# Patient Record
Sex: Male | Born: 1965 | Race: Black or African American | Hispanic: No | Marital: Married | Smoking: Never smoker
Health system: Southern US, Community
[De-identification: ages and names within clinical notes are randomized; demographics above are authoritative.]

## PROBLEM LIST (undated history)

## (undated) DIAGNOSIS — K219 Gastro-esophageal reflux disease without esophagitis: Secondary | ICD-10-CM

## (undated) DIAGNOSIS — I10 Essential (primary) hypertension: Secondary | ICD-10-CM

---

## 2007-11-13 ENCOUNTER — Emergency Department (HOSPITAL_BASED_OUTPATIENT_CLINIC_OR_DEPARTMENT_OTHER): Admission: EM | Admit: 2007-11-13 | Discharge: 2007-11-13 | Payer: Self-pay | Admitting: Emergency Medicine

## 2017-03-16 ENCOUNTER — Other Ambulatory Visit: Payer: Self-pay

## 2017-03-16 ENCOUNTER — Emergency Department (HOSPITAL_BASED_OUTPATIENT_CLINIC_OR_DEPARTMENT_OTHER): Payer: No Typology Code available for payment source

## 2017-03-16 ENCOUNTER — Encounter (HOSPITAL_BASED_OUTPATIENT_CLINIC_OR_DEPARTMENT_OTHER): Payer: Self-pay | Admitting: *Deleted

## 2017-03-16 ENCOUNTER — Observation Stay (HOSPITAL_BASED_OUTPATIENT_CLINIC_OR_DEPARTMENT_OTHER)
Admission: EM | Admit: 2017-03-16 | Discharge: 2017-03-17 | Disposition: A | Discharge status: Home / Self Care | Payer: No Typology Code available for payment source | Attending: Cardiology | Admitting: Cardiology

## 2017-03-16 DIAGNOSIS — K219 Gastro-esophageal reflux disease without esophagitis: Secondary | ICD-10-CM

## 2017-03-16 DIAGNOSIS — N189 Chronic kidney disease, unspecified: Secondary | ICD-10-CM | POA: Diagnosis not present

## 2017-03-16 DIAGNOSIS — R079 Chest pain, unspecified: Secondary | ICD-10-CM | POA: Diagnosis present

## 2017-03-16 DIAGNOSIS — R9431 Abnormal electrocardiogram [ECG] [EKG]: Secondary | ICD-10-CM

## 2017-03-16 DIAGNOSIS — Z9189 Other specified personal risk factors, not elsewhere classified: Secondary | ICD-10-CM | POA: Insufficient documentation

## 2017-03-16 DIAGNOSIS — R0602 Shortness of breath: Principal | ICD-10-CM

## 2017-03-16 DIAGNOSIS — E785 Hyperlipidemia, unspecified: Secondary | ICD-10-CM | POA: Insufficient documentation

## 2017-03-16 DIAGNOSIS — I10 Essential (primary) hypertension: Secondary | ICD-10-CM | POA: Insufficient documentation

## 2017-03-16 HISTORY — DX: Essential (primary) hypertension: I10

## 2017-03-16 HISTORY — DX: Gastro-esophageal reflux disease without esophagitis: K21.9

## 2017-03-16 LAB — CBC
HEMATOCRIT: 39.9 % (ref 39.0–52.0)
HEMATOCRIT: 41.1 % (ref 39.0–52.0)
HEMOGLOBIN: 13.6 g/dL (ref 13.0–17.0)
HEMOGLOBIN: 14.2 g/dL (ref 13.0–17.0)
MCH: 28.5 pg (ref 26.0–34.0)
MCH: 29 pg (ref 26.0–34.0)
MCHC: 34.1 g/dL (ref 30.0–36.0)
MCHC: 34.5 g/dL (ref 30.0–36.0)
MCV: 83.5 fL (ref 78.0–100.0)
MCV: 83.9 fL (ref 78.0–100.0)
Platelets: 188 10*3/uL (ref 150–400)
Platelets: 193 10*3/uL (ref 150–400)
RBC: 4.78 MIL/uL (ref 4.22–5.81)
RBC: 4.9 MIL/uL (ref 4.22–5.81)
RDW: 13.3 % (ref 11.5–15.5)
RDW: 13.3 % (ref 11.5–15.5)
WBC: 4.5 10*3/uL (ref 4.0–10.5)
WBC: 5.2 10*3/uL (ref 4.0–10.5)

## 2017-03-16 LAB — CREATININE, SERUM
Creatinine, Ser: 1.52 mg/dL — ABNORMAL HIGH (ref 0.61–1.24)
GFR calc Af Amer: 59 mL/min — ABNORMAL LOW (ref >60)
GFR calc non Af Amer: 51 mL/min — ABNORMAL LOW (ref >60)

## 2017-03-16 LAB — HEMOGLOBIN A1C
HEMOGLOBIN A1C: 6.2 % — AB (ref 4.8–5.6)
MEAN PLASMA GLUCOSE: 131.24 mg/dL

## 2017-03-16 LAB — TROPONIN I
Troponin I: <0.03 ng/mL (ref <0.03)
Troponin I: <0.03 ng/mL (ref <0.03)

## 2017-03-16 LAB — BRAIN NATRIURETIC PEPTIDE
B NATRIURETIC PEPTIDE 5: 12 pg/mL (ref 0.0–100.0)
B Natriuretic Peptide: 9.6 pg/mL (ref 0.0–100.0)

## 2017-03-16 LAB — BASIC METABOLIC PANEL
ANION GAP: 4 — AB (ref 5–15)
BUN: 13 mg/dL (ref 6–20)
CALCIUM: 9.2 mg/dL (ref 8.9–10.3)
CO2: 28 mmol/L (ref 22–32)
Chloride: 105 mmol/L (ref 101–111)
Creatinine, Ser: 1.49 mg/dL — ABNORMAL HIGH (ref 0.61–1.24)
GFR calc Af Amer: >60 mL/min (ref >60)
GFR calc non Af Amer: 52 mL/min — ABNORMAL LOW (ref >60)
GLUCOSE: 100 mg/dL — AB (ref 65–99)
Potassium: 3.8 mmol/L (ref 3.5–5.1)
Sodium: 137 mmol/L (ref 135–145)

## 2017-03-16 MED ORDER — ASPIRIN 81 MG PO CHEW
324.0000 mg | CHEWABLE_TABLET | Freq: Once | ORAL | Status: AC
  Administered 2017-03-16: 324 mg via ORAL
  Filled 2017-03-16: qty 4

## 2017-03-16 MED ORDER — NITROGLYCERIN 0.4 MG SL SUBL: 0.4000 mg | SUBLINGUAL_TABLET | SUBLINGUAL | Status: DC | PRN

## 2017-03-16 MED ORDER — SODIUM CHLORIDE 0.9 % IV SOLN
Freq: Once | INTRAVENOUS | Status: AC
  Administered 2017-03-16: 17:00:00 via INTRAVENOUS

## 2017-03-16 MED ORDER — HYDRALAZINE HCL 20 MG/ML IJ SOLN
10.0000 mg | Freq: Once | INTRAMUSCULAR | Status: AC
  Administered 2017-03-16: 10 mg via INTRAVENOUS
  Filled 2017-03-16: qty 1

## 2017-03-16 MED ORDER — HEPARIN SODIUM (PORCINE) 5000 UNIT/ML IJ SOLN
5000.0000 [IU] | Freq: Three times a day (TID) | INTRAMUSCULAR | Status: DC
  Administered 2017-03-17: 5000 [IU] via SUBCUTANEOUS
  Filled 2017-03-16: qty 1

## 2017-03-16 MED ORDER — ASPIRIN EC 81 MG PO TBEC
81.0000 mg | DELAYED_RELEASE_TABLET | Freq: Every day | ORAL | Status: DC
  Administered 2017-03-17: 81 mg via ORAL
  Filled 2017-03-16: qty 1

## 2017-03-16 MED ORDER — IOPAMIDOL (ISOVUE-370) INJECTION 76%
100.0000 mL | Freq: Once | INTRAVENOUS | Status: AC | PRN
  Administered 2017-03-16: 88 mL via INTRAVENOUS

## 2017-03-16 MED ORDER — ACETAMINOPHEN 325 MG PO TABS
650.0000 mg | ORAL_TABLET | ORAL | Status: DC | PRN
Start: 2017-03-16 — End: 2017-03-17
  Administered 2017-03-16 – 2017-03-17 (×2): 650 mg via ORAL
  Filled 2017-03-16 (×2): qty 2

## 2017-03-16 NOTE — ED Notes (Signed)
Pt ambulated 2 laps around the unit while on SpO2 monitoring. Pt had no episodes of dyspnea. HR elevated to 95 at the highest and the lowest SpO2 was 94%. HR immediately returned to 75 once pt returned to bed.

## 2017-03-16 NOTE — ED Triage Notes (Signed)
SOB for a month. He was seen by his MD today with changes in his EKG.

## 2017-03-16 NOTE — Progress Notes (Signed)
Patient discussed with Dr Donnald GarrePfeiffer. 51 yo male presents with primarily symptoms of SOB. CAD risk factors include HTN, HL, male over 7645. EKG with inferior and alteral T-wave inversions, no prior to compare. Troponins negative x 2 however only 2 hrs apart. Based on his symptoms, risk factors, and abnormal EKG I have recommended overnight obs with cycling of enzymes, EKGs. Echo in AM, possible stress test tomorrow AM. He is to be transferred to tele bed to cardiology service.   Dominga FerryJ Siomara Burkel MD

## 2017-03-16 NOTE — ED Notes (Signed)
Pt denies any hx of CP, nausea, or diaphoresis. Pt denies ShOB at this time, states it is intermittent with periods during rest, and with activity.

## 2017-03-16 NOTE — ED Notes (Signed)
[facility] arrived to transport pt 

## 2017-03-16 NOTE — ED Provider Notes (Signed)
[facility] EMERGENCY DEPARTMENT Provider Note   CSN: [id number] Arrival date & time: 03/16/17  1431     History   Chief Complaint Chief Complaint  Patient presents with  . Shortness of Breath    HPI Travis Harrell is a 51 y.o. male.  HPI Has had shortness of breath for couple weeks.  He reports that it comes and goes and sometimes is pretty bad with exertion, climbing the stairs in his home or doing cleaning at home can make him short of breath.  He reports if he rests it will resolve and he can resume activities.  He denies he is gotten chest pain with this.  No fever no cough.  No lower extremity swelling or pain.  Patient does for living drive cars.  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  No history of PE or DVT.  And finally went to see his doctor today due to the symptoms persisting and increasing.  EKG abnormality was identified and patient was referred to the emergency department. Past Medical History:  Diagnosis Date  . GERD (gastroesophageal reflux disease)   . Hypertension     Patient Active Problem List   Diagnosis Date Noted  . Chest pain 03/16/2017    History reviewed. No pertinent surgical history.     Home Medications    Prior to Admission medications   Medication Sig Start Date End Date Taking? Authorizing Provider  ATORVASTATIN CALCIUM PO Take by mouth.   Yes [provider]  Esomeprazole Magnesium (NEXIUM PO) Take by mouth.   Yes [provider]  LISINOPRIL PO Take by mouth.   Yes [provider]    Family History No family history on file.  Social History Social History   Tobacco Use  . Smoking status: Never Smoker  . Smokeless tobacco: Never Used  Substance Use Topics  . Alcohol use: No    Frequency: Never  . Drug use: No     Allergies   Patient has no known allergies.   Review of Systems Review of Systems 10  Systems reviewed and are negative for acute change except as noted in the HPI.  Physical Exam Updated Vital Signs BP (!) 139/93   Pulse (!) 53   Temp 98.2 F (36.8 C) (Oral)   Resp 15   Ht 6' (1.829 m)   Wt 96.6 kg (213 lb)   SpO2 100%   BMI 28.89 kg/m   Physical Exam  Constitutional: He is oriented to person, place, and time. He appears well-developed and well-nourished.  HENT:  Head: Normocephalic and atraumatic.  Nose: Nose normal.  Mouth/Throat: Oropharynx is clear and moist.  Eyes: Conjunctivae and EOM are normal.  Neck: Neck supple.  Cardiovascular: Normal rate, regular rhythm, normal heart sounds and intact distal pulses.  No murmur heard. Pulmonary/Chest: Effort normal and breath sounds normal. No respiratory distress.  Abdominal: Soft. He exhibits no distension. There is no tenderness. There is no guarding.  Musculoskeletal: Normal range of motion. He exhibits no edema or tenderness.  Neurological: He is alert and oriented to person, place, and time. No cranial nerve deficit. He exhibits normal muscle tone. Coordination normal.  Skin: Skin is warm and dry.  Psychiatric: He has a normal mood and affect.  Nursing note and vitals reviewed.    ED Treatments / Results  Labs (all labs ordered are listed, but only abnormal results are displayed) Labs  Reviewed  BASIC METABOLIC PANEL - Abnormal; Notable for the following components:      Result Value   Glucose, Bld 100 (*)    Creatinine, Ser 1.49 (*)    GFR calc non Af Amer 52 (*)    Anion gap 4 (*)    All other components within normal limits  CBC  TROPONIN I  BRAIN NATRIURETIC PEPTIDE  TROPONIN I    EKG  EKG Interpretation  Date/Time:  Tuesday March 16 2017 14:58:04 EST Ventricular Rate:  63 PR Interval:    QRS Duration: 95 QT Interval:  412 QTC Calculation: 422 R Axis:   40 Text Interpretation:  Sinus rhythm Nonspecific T abnormalities, diffuse leads agree. no old comparison Confirmed by Arby Barrette [phone]) on 03/16/2017 4:24:16 PM       Radiology Dg Chest 2 View  Result Date: 03/16/2017 CLINICAL DATA:  Shortness of breath. EXAM: CHEST  2 VIEW COMPARISON:  No prior . FINDINGS: Mediastinum hilar structures normal. Heart size normal. Lungs are clear. Mild elevation left hemidiaphragm. No pleural effusion or pneumothorax. No acute bony abnormality . IMPRESSION: 1. No acute cardiopulmonary disease. 2. Mild elevation left hemidiaphragm. Electronically Signed   By: Maisie Fus  Register   On: 03/16/2017 15:40   Ct Angio Chest Pe W/cm &/or Wo Cm  Result Date: 03/16/2017 CLINICAL DATA:  Shortness of breath for the past month. EXAM: CT ANGIOGRAPHY CHEST WITH CONTRAST TECHNIQUE: Multidetector CT imaging of the chest was performed using the standard protocol during bolus administration of intravenous contrast. Multiplanar CT image reconstructions and MIPs were obtained to evaluate the vascular anatomy. CONTRAST:  88mL ISOVUE-370 IOPAMIDOL (ISOVUE-370) INJECTION 76% COMPARISON:  Chest x-ray from same day. FINDINGS: Cardiovascular: Satisfactory opacification of the pulmonary arteries to the segmental level. No evidence of pulmonary embolism. Normal heart size. No pericardial effusion. Normal caliber thoracic aorta. Mediastinum/Nodes: No enlarged mediastinal, hilar, or axillary lymph nodes. Thyroid gland, trachea, and esophagus demonstrate no significant findings. Lungs/Pleura: Minimal bibasilar atelectasis. The lungs are otherwise clear. No focal consolidation, pleural effusion, or pneumothorax. No suspicious pulmonary nodule. Upper Abdomen: No acute abnormality. Musculoskeletal: Bilateral gynecomastia. No acute or significant osseous findings. Review of the MIP images confirms the above findings. IMPRESSION: 1. No evidence of pulmonary embolism. No acute intrathoracic process. Electronically Signed   By: Obie Dredge M.D.   On: 03/16/2017 17:00    Procedures Procedures (including critical care  time)  Medications Ordered in ED Medications  aspirin chewable tablet 324 mg (324 mg Oral Given 03/16/17 1654)  0.9 %  sodium chloride infusion ( Intravenous New Bag/Given 03/16/17 1658)  iopamidol (ISOVUE-370) 76 % injection 100 mL (88 mLs Intravenous Contrast Given 03/16/17 1644)     Initial Impression / Assessment and Plan / ED Course  I have reviewed the triage vital signs and the nursing notes.  Pertinent labs & imaging results that were available during my care of the patient were reviewed by me and considered in my medical decision making (see chart for details).    Consult: Reviewed with Dr. Wyline Mood of cardiology.  Based on EKG review, he advises for observation in the hospital and admission to his service.  Final Clinical Impressions(s) / ED Diagnoses   Final diagnoses:  EKG abnormality  Shortness of breath  Multiple risk factors for coronary artery disease   Patient is alert and appropriate.  No respiratory distress at rest.  Vital signs stable.  Aspirin provided.  Diagnostic workup has ruled out for PE.  With significant cardiac  risk factors and EKG anomaly, plan will be for admission to cardiology service. ED Discharge Orders    None       Arby BarrettePfeiffer, Gracin Mcpartland, MD 03/16/17 2009

## 2017-03-16 NOTE — H&P (Signed)
Cardiology Admission History and Physical:   Patient ID: CAYNE YOM; MRN: [id number]; DOB: 06/05/1965   Admission date: 03/16/2017  Primary Care Provider: Wilburn Mylar, MD Primary Cardiologist: None Primary Electrophysiologist:  None  Chief Complaint: DOE  Patient Profile:   Travis Harrell is a 51 y.o. male with a history of HTN and HLD, and CKD who is admitted for DOE.  History of Present Illness:   Travis Harrell is a 51 y.o. male with a history of HTN and HLD, and CKD who is admitted for DOE.  The patient reports that over the past several weeks he has had progressive DOE. He his now dyspneic with moderate exertion such as walking up stairs or carrying heavy objects. He has had some headaches but denies chest pain, palpitations, orthopnea, LE edema or other complaints.   He presented to [facility] ED with his symptoms. ECG showed TWI in inferior and lateral leads. Troponin was negative x2. Labs were otherwise notable for Cr 1.49 (baseline 1.6). He underwent CTA that showed no PE. His case was discussed with [facility] cardiology, and he was transferred for further management.  On arrival to [facility], BP 143/103 and HR 72. ECG showed similar ST changes. He reported some headache but denied chest pain, dyspnea or other symptoms.    Past Medical History:  Diagnosis Date  . GERD (gastroesophageal reflux disease)   . Hypertension     History reviewed. No pertinent surgical history.   Medications Prior to Admission: Prior to Admission medications   Medication Sig Start Date End Date Taking? Authorizing Provider  ATORVASTATIN CALCIUM PO Take by mouth.   Yes [provider]  Esomeprazole Magnesium (NEXIUM PO) Take by mouth.   Yes [provider]  LISINOPRIL PO Take by mouth.   Yes [provider]     Allergies:   No Known Allergies  Social History:   Social History   Socioeconomic History  . Marital status: Married    Spouse name: Not on file    . Number of children: Not on file  . Years of education: Not on file  . Highest education level: Not on file  Social Needs  . Financial resource strain: Not on file  . Food insecurity - worry: Not on file  . Food insecurity - inability: Not on file  . Transportation needs - medical: Not on file  . Transportation needs - non-medical: Not on file  Occupational History  . Not on file  Tobacco Use  . Smoking status: Never Smoker  . Smokeless tobacco: Never Used  Substance and Sexual Activity  . Alcohol use: No    Frequency: Never  . Drug use: No  . Sexual activity: Not on file  Other Topics Concern  . Not on file  Social History Narrative  . Not on file   Works as Education officer, environmental  Family History:  Reports "heart problems" in father, although is unable to specify  ROS:  Please see the history of present illness.  All other ROS reviewed and negative.     Physical Exam/Data:   Vitals:   03/16/17 2020 03/16/17 2030 03/16/17 2101 03/16/17 2148  BP: (!) 150/114 (!) 157/108 (!) 150/97 (!) 143/103  Pulse: (!) 58 84 67 72  Resp: 16 18 16 18   Temp:    98.3 F (36.8 C)  TempSrc:    Oral  SpO2: 100% 100% 100% 100%  Weight:    92.8 kg (204 lb 8 oz)  Height:  6' (1.829 m)   No intake or output data in the 24 hours ending 03/16/17 2233 Filed Weights   03/16/17 1440 03/16/17 2148  Weight: 96.6 kg (213 lb) 92.8 kg (204 lb 8 oz)   Body mass index is 27.74 kg/m.  General:  Well nourished, well developed, in no acute distress HEENT: normal Lymph: no adenopathy Neck: no elevation of JVD Cardiac:  normal S1, S2; RRR; no murmur  Lungs:  clear to auscultation bilaterally, no wheezing, rhonchi or rales  Abd: soft, nontender, no hepatomegaly  Ext: no LE edema Musculoskeletal:  No deformities, BUE and BLE strength normal and equal Skin: warm and dry  Neuro:  No focal abnormalities noted Psych:  Normal affect    EKG:  The ECG that was done and was personally reviewed and demonstrates TWI  as described above  Relevant CV Studies: No prior   Laboratory Data:  Chemistry Recent Labs  Lab 03/16/17 1523  NA 137  K 3.8  CL 105  CO2 28  GLUCOSE 100*  BUN 13  CREATININE 1.49*  CALCIUM 9.2  GFRNONAA 52*  GFRAA >60  ANIONGAP 4*    No results for input(s): PROT, ALBUMIN, AST, ALT, ALKPHOS, BILITOT in the last 168 hours. Hematology Recent Labs  Lab 03/16/17 1523  WBC 4.5  RBC 4.78  HGB 13.6  HCT 39.9  MCV 83.5  MCH 28.5  MCHC 34.1  RDW 13.3  PLT 188   Cardiac Enzymes Recent Labs  Lab 03/16/17 1523 03/16/17 1744  TROPONINI <0.03 <0.03   No results for input(s): TROPIPOC in the last 168 hours.  BNP Recent Labs  Lab 03/16/17 1523  BNP 9.6    DDimer No results for input(s): DDIMER in the last 168 hours.  Radiology/Studies:  Dg Chest 2 View  Result Date: 03/16/2017 CLINICAL DATA:  Shortness of breath. EXAM: CHEST  2 VIEW COMPARISON:  No prior . FINDINGS: Mediastinum hilar structures normal. Heart size normal. Lungs are clear. Mild elevation left hemidiaphragm. No pleural effusion or pneumothorax. No acute bony abnormality . IMPRESSION: 1. No acute cardiopulmonary disease. 2. Mild elevation left hemidiaphragm. Electronically Signed   By: Maisie Fushomas  Register   On: 03/16/2017 15:40   Ct Angio Chest Pe W/cm &/or Wo Cm  Result Date: 03/16/2017 CLINICAL DATA:  Shortness of breath for the past month. EXAM: CT ANGIOGRAPHY CHEST WITH CONTRAST TECHNIQUE: Multidetector CT imaging of the chest was performed using the standard protocol during bolus administration of intravenous contrast. Multiplanar CT image reconstructions and MIPs were obtained to evaluate the vascular anatomy. CONTRAST:  88mL ISOVUE-370 IOPAMIDOL (ISOVUE-370) INJECTION 76% COMPARISON:  Chest x-ray from same day. FINDINGS: Cardiovascular: Satisfactory opacification of the pulmonary arteries to the segmental level. No evidence of pulmonary embolism. Normal heart size. No pericardial effusion. Normal caliber  thoracic aorta. Mediastinum/Nodes: No enlarged mediastinal, hilar, or axillary lymph nodes. Thyroid gland, trachea, and esophagus demonstrate no significant findings. Lungs/Pleura: Minimal bibasilar atelectasis. The lungs are otherwise clear. No focal consolidation, pleural effusion, or pneumothorax. No suspicious pulmonary nodule. Upper Abdomen: No acute abnormality. Musculoskeletal: Bilateral gynecomastia. No acute or significant osseous findings. Review of the MIP images confirms the above findings. IMPRESSION: 1. No evidence of pulmonary embolism. No acute intrathoracic process. Electronically Signed   By: Obie DredgeWilliam T Derry M.D.   On: 03/16/2017 17:00    Assessment and Plan:   Travis Harrell is a 51 y.o. male with a history of HTN and HLD, and CKD who is admitted for DOE.  DOE The  patient presented with progressive DOE of unclear etiology. CTA shows no PE. ECG shows non-specific changes, and troponin is negative x2. His presentation is not consistent with acute MI, but his dyspnea may represent an angina equivalent. He has no known CAD but does have risk factors including HTN, HLD and male gender. Heart failure can also be considered as a cause of his symptoms, although he does not have other signs and symptoms of hypervolemia. At this time, will continue to trend troponin and monitor symptoms. Will plan for stress test and echocardiogram in AM unless there is a clinical change that would warrant direct angiography. -Continue to monitor on telemetry -Continue to monitor symptoms and trend troponin -BNP ordered  -Will plan for echocardiogram and nuclear stress test in AM (pharmacologic nuclear given progressive exercise intolerance) -Lipid panel, HgA1c ordered  HTN BP elevated in the ED, likely in setting of discomfort.  -Continue lisinopril -Add additonal covearage as needed  HLD -Lipid panel ordered -Continue atorvastatin  CKD Cr 1.49 in ED, which appears to be about at baseline per  review of CareEverywhere. He follows with an outpatient nephrologist. -Continue to monitor -Will need to be cautious with contrast agents  GERD -Continue PPI   Severity of Illness: The appropriate patient status for this patient is OBSERVATION. Observation status is judged to be reasonable and necessary in order to provide the required intensity of service to ensure the patient's safety. The patient's presenting symptoms, physical exam findings, and initial radiographic and laboratory data in the context of their medical condition is felt to place them at decreased risk for further clinical deterioration. Furthermore, it is anticipated that the patient will be medically stable for discharge from the hospital within 2 midnights of admission. The following factors support the patient status of observation.   " The patient's presenting symptoms include DOE. " The physical exam findings include n/a. " The initial radiographic and laboratory data are not consistent with acute MI.     For questions or updates, please contact [facility] Please consult [email or web address] for contact info under Cardiology/STEMI.    Signed, Ernest Mallickaylor Emberli Ballester, MD  03/16/2017 10:33 PM

## 2017-03-16 NOTE — ED Notes (Signed)
EDP notified of pt's B/P. See new orders.  

## 2017-03-17 ENCOUNTER — Observation Stay (HOSPITAL_BASED_OUTPATIENT_CLINIC_OR_DEPARTMENT_OTHER): Payer: No Typology Code available for payment source

## 2017-03-17 DIAGNOSIS — R079 Chest pain, unspecified: Secondary | ICD-10-CM | POA: Diagnosis not present

## 2017-03-17 DIAGNOSIS — I110 Hypertensive heart disease with heart failure: Secondary | ICD-10-CM

## 2017-03-17 DIAGNOSIS — R0602 Shortness of breath: Secondary | ICD-10-CM

## 2017-03-17 DIAGNOSIS — I10 Essential (primary) hypertension: Secondary | ICD-10-CM

## 2017-03-17 DIAGNOSIS — E7800 Pure hypercholesterolemia, unspecified: Secondary | ICD-10-CM

## 2017-03-17 LAB — CBC
HCT: 40.7 % (ref 39.0–52.0)
Hemoglobin: 13.5 g/dL (ref 13.0–17.0)
MCH: 28.1 pg (ref 26.0–34.0)
MCHC: 33.2 g/dL (ref 30.0–36.0)
MCV: 84.6 fL (ref 78.0–100.0)
Platelets: 199 10*3/uL (ref 150–400)
RBC: 4.81 MIL/uL (ref 4.22–5.81)
RDW: 13.2 % (ref 11.5–15.5)
WBC: 5.2 10*3/uL (ref 4.0–10.5)

## 2017-03-17 LAB — ECHOCARDIOGRAM COMPLETE
HEIGHTINCHES: 72 in
Weight: 3272 oz

## 2017-03-17 LAB — BASIC METABOLIC PANEL
Anion gap: 10 (ref 5–15)
BUN: 9 mg/dL (ref 6–20)
CALCIUM: 9 mg/dL (ref 8.9–10.3)
CHLORIDE: 105 mmol/L (ref 101–111)
CO2: 24 mmol/L (ref 22–32)
CREATININE: 1.43 mg/dL — AB (ref 0.61–1.24)
GFR calc Af Amer: >60 mL/min (ref >60)
GFR calc non Af Amer: 55 mL/min — ABNORMAL LOW (ref >60)
GLUCOSE: 102 mg/dL — AB (ref 65–99)
POTASSIUM: 4 mmol/L (ref 3.5–5.1)
SODIUM: 139 mmol/L (ref 135–145)

## 2017-03-17 LAB — LIPID PANEL
CHOL/HDL RATIO: 3.8 ratio
CHOLESTEROL: 171 mg/dL (ref 0–200)
HDL: 45 mg/dL (ref >40)
LDL Cholesterol: 103 mg/dL — ABNORMAL HIGH (ref 0–99)
Triglycerides: 117 mg/dL (ref <150)
VLDL: 23 mg/dL (ref 0–40)

## 2017-03-17 LAB — NM MYOCAR MULTI W/SPECT W/WALL MOTION / EF
CHL CUP MPHR: 167 {beats}/min
CSEPHR: 62 %
Peak HR: 105 {beats}/min
Rest HR: 62 {beats}/min

## 2017-03-17 LAB — TROPONIN I: Troponin I: <0.03 ng/mL (ref <0.03)

## 2017-03-17 LAB — HIV ANTIBODY (ROUTINE TESTING W REFLEX): HIV Screen 4th Generation wRfx: NONREACTIVE

## 2017-03-17 MED ORDER — AMLODIPINE BESYLATE 5 MG PO TABS: 5.0000 mg | ORAL_TABLET | Freq: Every day | ORAL | 6 refills | Status: AC

## 2017-03-17 MED ORDER — REGADENOSON 0.4 MG/5ML IV SOLN
0.4000 mg | Freq: Once | INTRAVENOUS | Status: AC
  Administered 2017-03-17: 0.4 mg via INTRAVENOUS

## 2017-03-17 MED ORDER — AMLODIPINE BESYLATE 5 MG PO TABS: 5.0000 mg | ORAL_TABLET | Freq: Every day | ORAL | Status: DC

## 2017-03-17 MED ORDER — TECHNETIUM TC 99M TETROFOSMIN IV KIT
10.0000 | PACK | Freq: Once | INTRAVENOUS | Status: AC | PRN
  Administered 2017-03-17: 10 via INTRAVENOUS

## 2017-03-17 MED ORDER — ATORVASTATIN CALCIUM 40 MG PO TABS
40.0000 mg | ORAL_TABLET | Freq: Every day | ORAL | Status: DC
  Administered 2017-03-17: 40 mg via ORAL
  Filled 2017-03-17: qty 1

## 2017-03-17 MED ORDER — LISINOPRIL 10 MG PO TABS
10.0000 mg | ORAL_TABLET | Freq: Every day | ORAL | Status: DC
  Administered 2017-03-17: 10 mg via ORAL
  Filled 2017-03-17: qty 1

## 2017-03-17 MED ORDER — PANTOPRAZOLE SODIUM 40 MG PO TBEC
40.0000 mg | DELAYED_RELEASE_TABLET | Freq: Every day | ORAL | Status: DC
  Administered 2017-03-17: 40 mg via ORAL
  Filled 2017-03-17: qty 1

## 2017-03-17 MED ORDER — TECHNETIUM TC 99M TETROFOSMIN IV KIT
29.0000 | PACK | Freq: Once | INTRAVENOUS | Status: AC | PRN
  Administered 2017-03-17: 29 via INTRAVENOUS

## 2017-03-17 MED ORDER — REGADENOSON 0.4 MG/5ML IV SOLN
INTRAVENOUS | Status: AC
Start: 2017-03-17 — End: 2017-03-17
  Filled 2017-03-17: qty 5

## 2017-03-17 NOTE — Plan of Care (Signed)
  Pain Managment: General experience of comfort will improve 03/17/2017 0959 - Progressing by Cathi Roan, RN Note Pt has been pain free this shift, only complains of intermittent shortness of breath; stress test pending   Education: Knowledge of General Education information will improve 03/17/2017 0959 - Completed/Met by Cathi Roan, RN

## 2017-03-17 NOTE — Plan of Care (Signed)
  Progressing Education: Knowledge of General Education information will improve 03/17/2017 0401 - Progressing by Horris Latinouvall, Darlys Buis G, RN Note Pt informed of valuables policy and educated on his plan of care and unit routine.  Oriented to room and unit, and instructed on use of call light and phone.  Alonza BogusDuvall, Marquel Spoto Gray  03/17/2017 [id number] - Progressing by Horris Latinouvall, Miquel Lamson G, RN Pain Managment: General experience of comfort will improve 03/17/2017 0401 - Progressing by Horris Latinouvall, Stepan Verrette G, RN Note Denies c/o chest pain during this shift.  Alonza BogusDuvall, Shakoya Gilmore Gray  03/17/2017 [id number] - Progressing by Horris Latinouvall, Yanis Larin G, RN

## 2017-03-17 NOTE — Progress Notes (Signed)
  Echocardiogram 2D Echocardiogram has been performed.  Travis SkeenVijay  Brodin Harrell 03/17/2017, 11:19 AM

## 2017-03-17 NOTE — Discharge Instructions (Signed)

## 2017-03-17 NOTE — Discharge Summary (Signed)
Discharge Summary    Patient ID: Travis Harrell,  MRN: [id number], DOB/AGE: 10/31/1965 51 y.o.  Admit date: 03/16/2017 Discharge date: 03/17/2017   Primary Care Provider: Wilburn MylarKelly, Samuel S Primary Cardiologist: new - Dr. Anne FuSkains  Discharge Diagnoses    Principal Problem:   Shortness of breath Active Problems:   Chest pain   CKD (chronic kidney disease)   GERD (gastroesophageal reflux disease)   HLD (hyperlipidemia)   Multiple risk factors for coronary artery disease   Essential hypertension   Allergies No Known Allergies   History of Present Illness     Travis Harrell is a 51 y.o. male with a history of HTN and HLD, and CKD who is admitted for DOE.  The patient reports that over the past several weeks he has had progressive DOE. He his now dyspneic with moderate exertion such as walking up stairs or carrying heavy objects. He has had some headaches but denies chest pain, palpitations, orthopnea, LE edema or other complaints.   He presented to [facility] with his symptoms. ECG showed TWI in inferior and lateral leads. Troponin was negative x2. Labs were otherwise notable for Cr 1.49 (baseline 1.6). He underwent CTA that showed no PE. [facility], and he was transferred for further management.  [facility] BP 143/103 and HR 72. ECG showed similar ST changes. He reported some headache but denied chest pain, dyspnea or other symptoms.   Hospital Course     Consultants: none  He was admitted to cardiology and underwent echocardiogram which showed normal LV function, no WMA, and moderate LVH that is likely related to hypertensive heart disease. Pressures were largely uncontrolled this admission. Norvasc 5 mg was added to his home lisinopril. He was instructed to keep a BP log and bring this to his next doctor's appt.   He also underwent NM lexiscan myoview that showed no evidence of reversible ischemia. T wave inversions were noted,  but this is likely related to his hypertensive heart disease.   Patient seen and examined by Dr. Anne FuSkains today and was stable for discharge. All follow up has been arranged.  _____________  Discharge Vitals Blood pressure (!) 156/113, pulse 86, temperature 97.6 F (36.4 C), temperature source Oral, resp. rate 18, height 6' (1.829 m), weight 204 lb 8 oz (92.8 kg), SpO2 100 %.  Filed Weights   03/16/17 1440 03/16/17 2148  Weight: 213 lb (96.6 kg) 204 lb 8 oz (92.8 kg)    Labs & Radiologic Studies    CBC Recent Labs    03/16/17 2214 03/17/17 0352  WBC 5.2 5.2  HGB 14.2 13.5  HCT 41.1 40.7  MCV 83.9 84.6  PLT 193 199   Basic Metabolic Panel Recent Labs    40/98/1110/07/30 1523 03/16/17 2214 03/17/17 0352  NA 137  --  139  K 3.8  --  4.0  CL 105  --  105  CO2 28  --  24  GLUCOSE 100*  --  102*  BUN 13  --  9  CREATININE 1.49* 1.52* 1.43*  CALCIUM 9.2  --  9.0   Liver Function Tests No results for input(s): AST, ALT, ALKPHOS, BILITOT, PROT, ALBUMIN in the last 72 hours. No results for input(s): LIPASE, AMYLASE in the last 72 hours. Cardiac Enzymes Recent Labs    03/16/17 1744 03/16/17 2214 03/17/17 0352  TROPONINI <0.03 <0.03 <0.03   BNP Invalid input(s): POCBNP D-Dimer No results for  input(s): DDIMER in the last 72 hours. Hemoglobin A1C Recent Labs    03/16/17 2214  HGBA1C 6.2*   Fasting Lipid Panel Recent Labs    03/17/17 0352  CHOL 171  HDL 45  LDLCALC 103*  TRIG 117  CHOLHDL 3.8   Thyroid Function Tests No results for input(s): TSH, T4TOTAL, T3FREE, THYROIDAB in the last 72 hours.  Invalid input(s): FREET3 _____________  Dg Chest 2 View  Result Date: 03/16/2017 CLINICAL DATA:  Shortness of breath. EXAM: CHEST  2 VIEW COMPARISON:  No prior . FINDINGS: Mediastinum hilar structures normal. Heart size normal. Lungs are clear. Mild elevation left hemidiaphragm. No pleural effusion or pneumothorax. No acute bony abnormality . IMPRESSION: 1. No acute  cardiopulmonary disease. 2. Mild elevation left hemidiaphragm. Electronically Signed   By: Maisie Fushomas  Register   On: 03/16/2017 15:40   Ct Angio Chest Pe W/cm &/or Wo Cm  Result Date: 03/16/2017 CLINICAL DATA:  Shortness of breath for the past month. EXAM: CT ANGIOGRAPHY CHEST WITH CONTRAST TECHNIQUE: Multidetector CT imaging of the chest was performed using the standard protocol during bolus administration of intravenous contrast. Multiplanar CT image reconstructions and MIPs were obtained to evaluate the vascular anatomy. CONTRAST:  88mL ISOVUE-370 IOPAMIDOL (ISOVUE-370) INJECTION 76% COMPARISON:  Chest x-ray from same day. FINDINGS: Cardiovascular: Satisfactory opacification of the pulmonary arteries to the segmental level. No evidence of pulmonary embolism. Normal heart size. No pericardial effusion. Normal caliber thoracic aorta. Mediastinum/Nodes: No enlarged mediastinal, hilar, or axillary lymph nodes. Thyroid gland, trachea, and esophagus demonstrate no significant findings. Lungs/Pleura: Minimal bibasilar atelectasis. The lungs are otherwise clear. No focal consolidation, pleural effusion, or pneumothorax. No suspicious pulmonary nodule. Upper Abdomen: No acute abnormality. Musculoskeletal: Bilateral gynecomastia. No acute or significant osseous findings. Review of the MIP images confirms the above findings. IMPRESSION: 1. No evidence of pulmonary embolism. No acute intrathoracic process. Electronically Signed   By: Obie DredgeWilliam T Derry M.D.   On: 03/16/2017 17:00   Nm Myocar Multi W/spect W/wall Motion / Ef  Result Date: 03/17/2017 CLINICAL DATA:  Chest pain. History of hypertension, hyperlipidemia and chronic kidney disease. EXAM: MYOCARDIAL IMAGING WITH SPECT (REST AND PHARMACOLOGIC-STRESS) GATED LEFT VENTRICULAR WALL MOTION STUDY LEFT VENTRICULAR EJECTION FRACTION TECHNIQUE: Standard myocardial SPECT imaging was performed after resting intravenous injection of 10 mCi Tc-5611m tetrofosmin. Subsequently,  intravenous infusion of Lexiscan was performed under the supervision of the Cardiology staff. At peak effect of the drug, 30 mCi Tc-3211m tetrofosmin was injected intravenously and standard myocardial SPECT imaging was performed. Quantitative gated imaging was also performed to evaluate left ventricular wall motion, and estimate left ventricular ejection fraction. COMPARISON:  Chest radiograph- 03/16/2017; chest CT - 03/16/2017 FINDINGS: Raw images: No significant chest wall or diaphragmatic attenuation. No significant patient motion artifact. Focal area of contamination is seen overlying the peripheral aspect the left upper abdomen on the provided stress images. Perfusion: There is a grossly matched area of attenuation involving the apical aspect of the septum without associated regional wall motion abnormality. No scintigraphic evidence of prior infarction or pharmacologically induced ischemia. Wall Motion: Normal left ventricular wall motion. No left ventricular dilation. Left Ventricular Ejection Fraction: 52 % End diastolic volume 113 ml End systolic volume 54 ml IMPRESSION: 1. No scintigraphic evidence of prior infarction pharmacologically induced ischemia. 2. Normal left ventricular wall motion. 3. Left ventricular ejection fraction 52% 4. Non invasive risk stratification*: Low *2012 Appropriate Use Criteria for Coronary Revascularization Focused Update: J Am Coll Cardiol. 2012;59(9):857-881. http://content.dementiazones.comonlinejacc.org/article.aspx?articleid=1201161 Electronically Signed   By: Jonny RuizJohn  Watts M.D.   On: 03/17/2017 12:18     Diagnostic Studies/Procedures    Echo 03/17/17: Study Conclusions - Left ventricle: The cavity size was normal. Wall thickness was   increased in a pattern of moderate LVH. Systolic function was   normal. The estimated ejection fraction was in the range of 60%   to 65%. Wall motion was normal; there were no regional wall   motion abnormalities. Left ventricular diastolic function    parameters were normal.   Myoview 03/17/17: 1. No scintigraphic evidence of prior infarction pharmacologically induced ischemia. 2. Normal left ventricular wall motion. 3. Left ventricular ejection fraction 52% 4. Non invasive risk stratification*: Low  Disposition   Pt is being discharged home today in good condition.  Follow-up Plans & Appointments     Discharge Instructions    Diet - low sodium heart healthy   Complete by:  As directed    Increase activity slowly   Complete by:  As directed       Discharge Medications   Allergies as of 03/17/2017   No Known Allergies     Medication List    TAKE these medications   acetaminophen 325 MG tablet Commonly known as:  TYLENOL Take 650 mg by mouth every 6 (six) hours as needed for mild pain.   amLODipine 5 MG tablet Commonly known as:  NORVASC Take 1 tablet (5 mg total) by mouth daily.   ARTIFICIAL TEARS OP Place 1 tablet into both eyes daily as needed (dry eyes).   atorvastatin 40 MG tablet Commonly known as:  LIPITOR Take 40 mg by mouth daily.   cholecalciferol 1000 units tablet Commonly known as:  VITAMIN D Take 1,000 Units by mouth daily.   lisinopril 20 MG tablet Commonly known as:  PRINIVIL,ZESTRIL Take 20 mg by mouth daily.         Follow up with PCP or cardiology for HTN.  Duration of Discharge Encounter   Greater than 30 minutes including physician time.  Signed, Roe Rutherford Duke PA-C 03/17/2017, 2:32 PM   Personally seen and examined. Agree with above. 51 year old with intermittent shortness of breath.  T wave inversions noted on ECG.  Prompted further evaluation.  Negative for PE.  On lisinopril 10 mg for hypertension.  Blood pressure high.  Nuclear stress test was reassuring with no ischemia.  Echocardiogram showed normal ejection fraction with moderate LVH, suggestive of hypertensive heart disease.  On exam, regular rate and rhythm, lungs are clear, pleasant, moves all  extremities.  Intermittent dyspnea -May be related to hypertension, possible anxiety component as well.  Further testing was reassuring. -He may be discharged with increase in blood pressure medication.  I will add amlodipine 5 mg once a day.  He may need a diuretic as well such as chlorthalidone in the future.  Hypertensive heart disease -This is most likely why his T wave inversions are noted on ECG.  Hypertension.  LVH.  Continue to treat more aggressively.  Adding amlodipine.  Hyperlipidemia -Continue with atorvastatin.  Based upon his recent hospitalization, I am comfortable with him returning to work next Monday.  Continue to follow-up with primary physician.  Donato Schultz, MD

## 2017-03-17 NOTE — Progress Notes (Signed)
Progress Note  Patient Name: Travis Harrell Date of Encounter: 03/17/2017  Primary Cardiologist: new - Dr. Wyline MoodBranch / Dr. Anne FuSkains  Subjective   Pt was intermittently dyspneic overnight, no chest pain  Inpatient Medications    Scheduled Meds: . aspirin EC  81 mg Oral Daily  . atorvastatin  40 mg Oral q1800  . heparin  5,000 Units Subcutaneous Q8H  . lisinopril  10 mg Oral Daily  . pantoprazole  40 mg Oral Daily   Continuous Infusions:  PRN Meds: acetaminophen, nitroGLYCERIN   Vital Signs    Vitals:   03/16/17 2101 03/16/17 2148 03/17/17 0629 03/17/17 0851  BP: (!) 150/97 (!) 143/103 (!) 136/91 (!) 147/100  Pulse: 67 72 66 60  Resp: 16 18 18    Temp:  98.3 F (36.8 C) 98.1 F (36.7 C)   TempSrc:  Oral Oral   SpO2: 100% 100% 98%   Weight:  204 lb 8 oz (92.8 kg)    Height:  6' (1.829 m)      Intake/Output Summary (Last 24 hours) at 03/17/2017 0919 Last data filed at 03/16/2017 2359 Gross per 24 hour  Intake 1240 ml  Output -  Net 1240 ml   Filed Weights   03/16/17 1440 03/16/17 2148  Weight: 213 lb (96.6 kg) 204 lb 8 oz (92.8 kg)     Physical Exam   General: Well developed, well nourished, male appearing in no acute distress. Head: Normocephalic, atraumatic.  Neck: Supple without bruits, no JVD Lungs:  Resp regular and unlabored, CTA. Heart: RRR, S1, S2, no S3, S4, or murmur; no rub. Abdomen: Soft, non-tender, non-distended with normoactive bowel sounds. No hepatomegaly. No rebound/guarding. No obvious abdominal masses. Extremities: No clubbing, cyanosis, edema. Distal pedal pulses are 2+ bilaterally. Neuro: Alert and oriented X 3. Moves all extremities spontaneously. Psych: Normal affect.  Labs    Chemistry Recent Labs  Lab 03/16/17 1523 03/16/17 2214 03/17/17 0352  NA 137  --  139  K 3.8  --  4.0  CL 105  --  105  CO2 28  --  24  GLUCOSE 100*  --  102*  BUN 13  --  9  CREATININE 1.49* 1.52* 1.43*  CALCIUM 9.2  --  9.0  GFRNONAA 52* 51*  55*  GFRAA >60 59* >60  ANIONGAP 4*  --  10     Hematology Recent Labs  Lab 03/16/17 1523 03/16/17 2214 03/17/17 0352  WBC 4.5 5.2 5.2  RBC 4.78 4.90 4.81  HGB 13.6 14.2 13.5  HCT 39.9 41.1 40.7  MCV 83.5 83.9 84.6  MCH 28.5 29.0 28.1  MCHC 34.1 34.5 33.2  RDW 13.3 13.3 13.2  PLT 188 193 199    Cardiac Enzymes Recent Labs  Lab 03/16/17 1523 03/16/17 1744 03/16/17 2214 03/17/17 0352  TROPONINI <0.03 <0.03 <0.03 <0.03   No results for input(s): TROPIPOC in the last 168 hours.   BNP Recent Labs  Lab 03/16/17 1523 03/16/17 2214  BNP 9.6 12.0     DDimer No results for input(s): DDIMER in the last 168 hours.   Radiology    Dg Chest 2 View  Result Date: 03/16/2017 CLINICAL DATA:  Shortness of breath. EXAM: CHEST  2 VIEW COMPARISON:  No prior . FINDINGS: Mediastinum hilar structures normal. Heart size normal. Lungs are clear. Mild elevation left hemidiaphragm. No pleural effusion or pneumothorax. No acute bony abnormality . IMPRESSION: 1. No acute cardiopulmonary disease. 2. Mild elevation left hemidiaphragm. Electronically Signed   By:  Thomas  Register   On: 03/16/2017 15:40   Ct Angio Chest Pe W/cm &/or Wo Cm  Result Date: 03/16/2017 CLINICAL DATA:  Shortness of breath for the past month. EXAM: CT ANGIOGRAPHY CHEST WITH CONTRAST TECHNIQUE: Multidetector CT imaging of the chest was performed using the standard protocol during bolus administration of intravenous contrast. Multiplanar CT image reconstructions and MIPs were obtained to evaluate the vascular anatomy. CONTRAST:  88mL ISOVUE-370 IOPAMIDOL (ISOVUE-370) INJECTION 76% COMPARISON:  Chest x-ray from same day. FINDINGS: Cardiovascular: Satisfactory opacification of the pulmonary arteries to the segmental level. No evidence of pulmonary embolism. Normal heart size. No pericardial effusion. Normal caliber thoracic aorta. Mediastinum/Nodes: No enlarged mediastinal, hilar, or axillary lymph nodes. Thyroid gland, trachea,  and esophagus demonstrate no significant findings. Lungs/Pleura: Minimal bibasilar atelectasis. The lungs are otherwise clear. No focal consolidation, pleural effusion, or pneumothorax. No suspicious pulmonary nodule. Upper Abdomen: No acute abnormality. Musculoskeletal: Bilateral gynecomastia. No acute or significant osseous findings. Review of the MIP images confirms the above findings. IMPRESSION: 1. No evidence of pulmonary embolism. No acute intrathoracic process. Electronically Signed   By: Obie DredgeWilliam T Derry M.D.   On: 03/16/2017 17:00     Telemetry    sinus - Personally Reviewed  ECG    No new tracings - Personally Reviewed   Cardiac Studies   Myoview pending  Echo pending  Patient Profile     51 y.o. male with a history of HTN and HLD, and CKD who is admitted for DOE.  Assessment & Plan    1. Dyspnea - troponin x 4 negative - pt seen in nuc med, myoview results pending - pt states he was sometimes out of breath overnight - BNP 12.0  2. HTN - pressures still uncontrolled - echo will guide medication selection for better pressure control  3. HLD - lipid panel with LDL 103, HDL 45, triglycerides 117  4. CKD - sCr 1.43, making urine    Signed, Marcelino Dusterngela Nicole Duke , PA-C 9:19 AM 03/17/2017 Pager: [phone]  Personally seen and examined. Agree with above. 51 year old with intermittent shortness of breath.  T wave inversions noted on ECG.  Prompted further evaluation.  Negative for PE.  On lisinopril 10 mg for hypertension.  Blood pressure high.  Nuclear stress test was reassuring with no ischemia.  Echocardiogram showed normal ejection fraction with moderate LVH, suggestive of hypertensive heart disease.  On exam, regular rate and rhythm, lungs are clear, pleasant, moves all extremities.  Intermittent dyspnea -May be related to hypertension, possible anxiety component as well.  Further testing was reassuring. -He may be discharged with increase in blood pressure  medication.  I will add amlodipine 5 mg once a day.  He may need a diuretic as well such as chlorthalidone in the future.  Hypertensive heart disease -This is most likely why his T wave inversions are noted on ECG.  Hypertension.  LVH.  Continue to treat more aggressively.  Adding amlodipine.  Hyperlipidemia -Continue with atorvastatin.  Based upon his recent hospitalization, I am comfortable with him returning to work next Monday.  Continue to follow-up with primary physician.  Donato SchultzMark Skains, MD

## 2017-03-17 NOTE — Progress Notes (Signed)
   Travis Harrell presented for a nuclear stress test today.  No immediate complications.  Stress imaging is pending at this time.  Preliminary EKG findings may be listed in the chart, but the stress test result will not be finalized until perfusion imaging is complete.  Travis Rutherfordngela Nicole Kalianna Verbeke, PA-C 03/17/2017, 9:17 AM

## 2019-05-27 IMAGING — NM NM MYOCAR MULTI W/SPECT W/WALL MOTION & EF
1 series · 6 of 6 positions shown · non-contrast
Comparison: Chest radiograph- 03/16/2017; chest CT - 03/16/2017

CLINICAL DATA: Chest pain. History of hypertension, hyperlipidemia
and chronic kidney disease.

EXAM:
MYOCARDIAL IMAGING WITH SPECT (REST AND PHARMACOLOGIC-STRESS)
GATED LEFT VENTRICULAR WALL MOTION STUDY
LEFT VENTRICULAR EJECTION FRACTION
TECHNIQUE: Standard myocardial SPECT imaging was performed after resting
intravenous injection of 10 mCi 7c-KKm tetrofosmin. Subsequently,
intravenous infusion of Lexiscan was performed under the supervision
of the Cardiology staff. At peak effect of the drug, 30 mCi 7c-KKm
tetrofosmin was injected intravenously and standard myocardial SPECT
imaging was performed. Quantitative gated imaging was also performed
to evaluate left ventricular wall motion, and estimate left
ventricular ejection fraction.

[Series 1: rest · 6.51mm/px · 6 of 64 frames shown]
[frame 6/64]
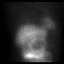
[frame 16/64]
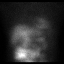
[frame 27/64]
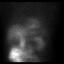
[frame 38/64]
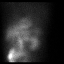
[frame 48/64]
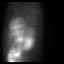
[frame 59/64]
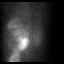

[6 of 6 positions shown; findings below may reference images not displayed]

FINDINGS: Raw images: No significant chest wall or diaphragmatic attenuation.
No significant patient motion artifact. Focal area of contamination
is seen overlying the peripheral aspect the left upper abdomen on
the provided stress images.

Perfusion: There is a grossly matched area of attenuation involving
the apical aspect of the septum without associated regional wall
motion abnormality. No scintigraphic evidence of prior infarction or
pharmacologically induced ischemia.

Wall Motion: Normal left ventricular wall motion. No left
ventricular dilation.

Left Ventricular Ejection Fraction: 52 %

End diastolic volume 113 ml

End systolic volume 54 ml
IMPRESSION: 1. No scintigraphic evidence of prior infarction pharmacologically
induced ischemia.

2. Normal left ventricular wall motion.

3. Left ventricular ejection fraction 52%

4. Non invasive risk stratification*: Low

*0460 Appropriate Use Criteria for Coronary Revascularization
Focused Update: J Am Coll Cardiol. 0460;59(9):857-881.
[URL]

## 2021-12-05 ENCOUNTER — Emergency Department (HOSPITAL_COMMUNITY)
Admission: EM | Admit: 2021-12-05 | Discharge: 2021-12-05 | Disposition: A | Discharge status: Home / Self Care | Payer: 59 | Attending: Emergency Medicine | Admitting: Emergency Medicine

## 2021-12-05 ENCOUNTER — Emergency Department (HOSPITAL_COMMUNITY): Payer: 59

## 2021-12-05 ENCOUNTER — Other Ambulatory Visit: Payer: Self-pay

## 2021-12-05 ENCOUNTER — Encounter (HOSPITAL_COMMUNITY): Payer: Self-pay

## 2021-12-05 DIAGNOSIS — R519 Headache, unspecified: Secondary | ICD-10-CM | POA: Diagnosis present

## 2021-12-05 DIAGNOSIS — R200 Anesthesia of skin: Secondary | ICD-10-CM | POA: Insufficient documentation

## 2021-12-05 DIAGNOSIS — I10 Essential (primary) hypertension: Secondary | ICD-10-CM

## 2021-12-05 LAB — CBC WITH DIFFERENTIAL/PLATELET
Abs Immature Granulocytes: 0.01 10*3/uL (ref 0.00–0.07)
Basophils Absolute: 0 10*3/uL (ref 0.0–0.1)
Basophils Relative: 1 %
Eosinophils Absolute: 0.1 10*3/uL (ref 0.0–0.5)
Eosinophils Relative: 2 %
HCT: 43.5 % (ref 39.0–52.0)
Hemoglobin: 14.6 g/dL (ref 13.0–17.0)
Immature Granulocytes: 0 %
Lymphocytes Relative: 37 %
Lymphs Abs: 1.7 10*3/uL (ref 0.7–4.0)
MCH: 28.6 pg (ref 26.0–34.0)
MCHC: 33.6 g/dL (ref 30.0–36.0)
MCV: 85.3 fL (ref 80.0–100.0)
Monocytes Absolute: 0.3 10*3/uL (ref 0.1–1.0)
Monocytes Relative: 6 %
Neutro Abs: 2.5 10*3/uL (ref 1.7–7.7)
Neutrophils Relative %: 54 %
Platelets: 215 10*3/uL (ref 150–400)
RBC: 5.1 MIL/uL (ref 4.22–5.81)
RDW: 13.9 % (ref 11.5–15.5)
WBC: 4.6 10*3/uL (ref 4.0–10.5)
nRBC: 0 % (ref 0.0–0.2)

## 2021-12-05 LAB — COMPREHENSIVE METABOLIC PANEL
ALT: 30 U/L (ref 0–44)
AST: 26 U/L (ref 15–41)
Albumin: 4.1 g/dL (ref 3.5–5.0)
Alkaline Phosphatase: 108 U/L (ref 38–126)
Anion gap: 9 (ref 5–15)
BUN: 11 mg/dL (ref 6–20)
CO2: 25 mmol/L (ref 22–32)
Calcium: 9.4 mg/dL (ref 8.9–10.3)
Chloride: 105 mmol/L (ref 98–111)
Creatinine, Ser: 1.5 mg/dL — ABNORMAL HIGH (ref 0.61–1.24)
GFR, Estimated: 54 mL/min — ABNORMAL LOW (ref >60)
Glucose, Bld: 101 mg/dL — ABNORMAL HIGH (ref 70–99)
Potassium: 3.8 mmol/L (ref 3.5–5.1)
Sodium: 139 mmol/L (ref 135–145)
Total Bilirubin: 1 mg/dL (ref 0.3–1.2)
Total Protein: 7.8 g/dL (ref 6.5–8.1)

## 2021-12-05 LAB — URINALYSIS, ROUTINE W REFLEX MICROSCOPIC
Bilirubin Urine: NEGATIVE
Glucose, UA: NEGATIVE mg/dL
Hgb urine dipstick: NEGATIVE
Ketones, ur: NEGATIVE mg/dL
Leukocytes,Ua: NEGATIVE
Nitrite: NEGATIVE
Protein, ur: NEGATIVE mg/dL
Specific Gravity, Urine: 1.003 — ABNORMAL LOW (ref 1.005–1.030)
pH: 6 (ref 5.0–8.0)

## 2021-12-05 LAB — TROPONIN I (HIGH SENSITIVITY)
Troponin I (High Sensitivity): 4 ng/L (ref <18)
Troponin I (High Sensitivity): 6 ng/L (ref <18)

## 2021-12-05 NOTE — ED Provider Notes (Signed)
[facility] EMERGENCY DEPARTMENT Provider Note   CSN: [id number] Arrival date & time: 12/05/21  0915     History  Chief Complaint  Patient presents with   Numbness   Headache    Travis Harrell is a 56 y.o. male.  Pt reports he has had a headache since yesterday.  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  Pt saw his primary care provider today and was sent to the ED for evaluation.  Pt reports he has had left sided numbness since headache began yesterday.  Pt states he had 3 episodes of lip swelling before stopping lisinopril.  Pt reports he also has recently begun having problems with memory loss.  Pt and wife reports symptoms began 6 months ago.    The history is provided by the patient. No language interpreter was used.  Headache Pain location:  Generalized Radiates to:  Does not radiate Onset quality:  Gradual Duration:  1 day Timing:  Constant Progression:  Worsening Chronicity:  New Relieved by:  Nothing      Home Medications Prior to Admission medications   Medication Sig Start Date End Date Taking? Authorizing Provider  acetaminophen (TYLENOL) 325 MG tablet Take 650 mg by mouth every 6 (six) hours as needed for mild pain.    [provider]  amLODipine (NORVASC) 5 MG tablet Take 1 tablet (5 mg total) by mouth daily. 03/17/17   [facility], Roe Rutherford, PA  atorvastatin (LIPITOR) 40 MG tablet Take 40 mg by mouth daily. 01/17/17   [provider]  cholecalciferol (VITAMIN D) 1000 units tablet Take 1,000 Units by mouth daily.    [provider]  Hypromellose (ARTIFICIAL TEARS OP) Place 1 tablet into both eyes daily as needed (dry eyes).    [provider]  lisinopril (PRINIVIL,ZESTRIL) 20 MG tablet Take 20 mg by mouth daily. 02/21/17   [provider]      Allergies    Patient has no known allergies.    Review of Systems   Review of Systems  Neurological:  Positive for headaches.  All  other systems reviewed and are negative.   Physical Exam Updated Vital Signs BP (!) 134/93   Pulse 62   Temp 98.6 F (37 C) (Oral)   Resp 19   Ht 6' (1.829 m)   Wt 93.9 kg   SpO2 98%   BMI 28.07 kg/m  Physical Exam Vitals and nursing note reviewed.  Constitutional:      General: He is not in acute distress.    Appearance: He is well-developed.  HENT:     Head: Normocephalic and atraumatic.  Eyes:     Extraocular Movements: Extraocular movements intact.     Conjunctiva/sclera: Conjunctivae normal.     Pupils: Pupils are equal, round, and reactive to light.  Cardiovascular:     Rate and Rhythm: Normal rate and regular rhythm.     Heart sounds: No murmur heard. Pulmonary:     Effort: Pulmonary effort is normal. No respiratory distress.     Breath sounds: Normal breath sounds.  Abdominal:     Palpations: Abdomen is soft.     Tenderness: There is no abdominal tenderness.  Musculoskeletal:        General: No swelling.     Cervical back: Neck supple.  Skin:    General: Skin is warm and dry.     Capillary Refill: Capillary refill takes less than 2 seconds.  Neurological:     Mental  Status: He is alert and oriented to person, place, and time.     Cranial Nerves: No cranial nerve deficit.  Psychiatric:        Mood and Affect: Mood normal.     ED Results / Procedures / Treatments   Labs (all labs ordered are listed, but only abnormal results are displayed) Labs Reviewed  COMPREHENSIVE METABOLIC PANEL - Abnormal; Notable for the following components:      Result Value   Glucose, Bld 101 (*)    Creatinine, Ser 1.50 (*)    GFR, Estimated 54 (*)    All other components within normal limits  URINALYSIS, ROUTINE W REFLEX MICROSCOPIC - Abnormal; Notable for the following components:   Color, Urine COLORLESS (*)    Specific Gravity, Urine 1.003 (*)    All other components within normal limits  CBC WITH DIFFERENTIAL/PLATELET  TROPONIN I (HIGH SENSITIVITY)  TROPONIN I  (HIGH SENSITIVITY)    EKG EKG Interpretation  Date/Time:  Friday December 05 2021 09:24:03 EDT Ventricular Rate:  60 PR Interval:  139 QRS Duration: 90 QT Interval:  404 QTC Calculation: 404 R Axis:   25 Text Interpretation: Sinus rhythm Baseline wander in lead(s) V2 when compared to prior, previous t wave inversions are not as severe. no STEMI Confirmed by Theda Belfast ([id number]) on 12/05/2021 1:35:07 PM  Radiology CT Head Wo Contrast  Result Date: 12/05/2021 CLINICAL DATA:  Numbness or tingling, paresthesia (Ped 0-17y) EXAM: CT HEAD WITHOUT CONTRAST TECHNIQUE: Contiguous axial images were obtained from the base of the skull through the vertex without intravenous contrast. RADIATION DOSE REDUCTION: This exam was performed according to the departmental dose-optimization program which includes automated exposure control, adjustment of the mA and/or kV according to patient size and/or use of iterative reconstruction technique. COMPARISON:  None Available. FINDINGS: Brain: No evidence of acute large vascular territory infarction, hemorrhage, hydrocephalus, extra-axial collection or mass lesion/mass effect. Vascular: No hyperdense vessel identified. Skull: No acute fracture. Sinuses/Orbits: Clear sinuses.  No acute orbital findings. Other: No mastoid effusions. IMPRESSION: No evidence of acute intracranial abnormality. Electronically Signed   By: Feliberto Harts M.D.   On: 12/05/2021 10:09    Procedures Procedures    Medications Ordered in ED Medications - No data to display  ED Course/ Medical Decision Making/ A&P                           Medical Decision Making Amount and/or Complexity of Data Reviewed Labs: ordered. Radiology: ordered.   Pt's care turned over to Valetta Mole PA  Mri brain pending        Final Clinical Impression(s) / ED Diagnoses Final diagnoses:  None    Rx / DC Orders ED Discharge Orders     None         Elson Areas, [place] 12/05/21  1529    Tegeler, Canary Brim, MD 12/05/21 1556

## 2021-12-05 NOTE — ED Provider Notes (Signed)
  Physical Exam  BP (!) 134/93   Pulse 62   Temp 98.6 F (37 C) (Oral)   Resp 19   Ht 6' (1.829 m)   Wt 93.9 kg   SpO2 98%   BMI 28.07 kg/m   Physical Exam Vitals reviewed.  Constitutional:      Appearance: He is well-developed.  HENT:     Head: Normocephalic and atraumatic.  Cardiovascular:     Rate and Rhythm: Normal rate.  Pulmonary:     Effort: Pulmonary effort is normal.  Abdominal:     Palpations: Abdomen is soft.  Skin:    General: Skin is warm and dry.  Neurological:     Mental Status: He is alert and oriented to person, place, and time.     Procedures  Procedures  ED Course / MDM    Medical Decision Making Amount and/or Complexity of Data Reviewed Labs: ordered. Radiology: ordered.  Patient care assumed from Colin Broach. PA at shift change, please see her note for a full HPI. Briefly, patient here with elevated blood pressure, headache along with numbness on the left side.  Saw his PCP today and was sent here due to memory loss has been ongoing for the past 6 months.  Plan is for patient to have MRI brain if negative he may go home.  MRI Brain showed: 1. No acute intracranial abnormality.  2. Mild cerebral white matter T2 signal changes, nonspecific though  may reflect chronic small vessel ischemia, migraines, or prior  infection/inflammation.   These results were discussed with patient, he was provided with a copy of his MRI.  I did discuss with him appropriate follow-up with primary care physician.  Wife shows concern for elevated blood pressure however patient is low blood pressure documented 134/93, he was given a new blood pressure medication which she will pick up at the store today and began.  Patient overall in stable condition ready for discharge.    Portions of this note were generated with Scientist, clinical (histocompatibility and immunogenetics). Dictation errors may occur despite best attempts at proofreading.     Claude Manges, PA-C 12/05/21 1618    Gwyneth Sprout,  MD 12/05/21 1620

## 2021-12-05 NOTE — ED Notes (Signed)
Patient transported to MRI 

## 2021-12-05 NOTE — ED Triage Notes (Signed)
Pt arrived POV from the drs office c/o headaches and left sided numbness that started yesterday. Pt's LKW was 8am 12/04/21.

## 2021-12-05 NOTE — ED Notes (Signed)
Patient transported to CT 

## 2021-12-05 NOTE — Discharge Instructions (Addendum)
You are provided with results of your MRI brain on today's visit, please follow-up with your primary care physician at your earliest convenience.   Please make sure to pick up your blood pressure medication at the pharmacy and begin taking this.  If you experience any worsening headache, chest pain, shortness of breath please return to the emergency department.
